# Patient Record
Sex: Male | Born: 1975 | Race: White | Hispanic: No | Marital: Married | Smoking: Never smoker
Health system: Southern US, Community
[De-identification: ages and names within clinical notes are randomized; demographics above are authoritative.]

## PROBLEM LIST (undated history)

## (undated) DIAGNOSIS — Z789 Other specified health status: Secondary | ICD-10-CM

## (undated) HISTORY — PX: FINGER SURGERY: SHX640

---

## 2012-04-03 ENCOUNTER — Emergency Department (HOSPITAL_COMMUNITY)
Admission: EM | Admit: 2012-04-03 | Discharge: 2012-04-03 | Disposition: A | Discharge status: Home / Self Care | Payer: BC Managed Care – PPO | Attending: Emergency Medicine | Admitting: Emergency Medicine

## 2012-04-03 ENCOUNTER — Encounter (HOSPITAL_COMMUNITY): Payer: Self-pay | Admitting: Emergency Medicine

## 2012-04-03 DIAGNOSIS — Y9389 Activity, other specified: Secondary | ICD-10-CM | POA: Insufficient documentation

## 2012-04-03 DIAGNOSIS — W261XXA Contact with sword or dagger, initial encounter: Secondary | ICD-10-CM | POA: Insufficient documentation

## 2012-04-03 DIAGNOSIS — S61209A Unspecified open wound of unspecified finger without damage to nail, initial encounter: Secondary | ICD-10-CM | POA: Insufficient documentation

## 2012-04-03 DIAGNOSIS — Y929 Unspecified place or not applicable: Secondary | ICD-10-CM | POA: Insufficient documentation

## 2012-04-03 DIAGNOSIS — W260XXA Contact with knife, initial encounter: Secondary | ICD-10-CM | POA: Insufficient documentation

## 2012-04-03 DIAGNOSIS — S61219A Laceration without foreign body of unspecified finger without damage to nail, initial encounter: Secondary | ICD-10-CM

## 2012-04-03 MED ORDER — LIDOCAINE HCL 2 % IJ SOLN
INTRAMUSCULAR | Status: AC
  Administered 2012-04-03: 20:00:00
  Filled 2012-04-03: qty 20

## 2012-04-03 NOTE — ED Provider Notes (Signed)
History     CSN: [id number]  Arrival date & time 04/03/12  7829   First MD Initiated Contact with Patient 04/03/12 1941      Chief Complaint  Patient presents with  . Laceration    (Consider location/radiation/quality/duration/timing/severity/associated sxs/prior treatment) HPI Aberham Mcfarren is a 36 y.o. male who presents with complaint or laceration to right 5th finger. States was reaching into a drawer where he had a knife, states it cut him on the outer part of proximal right 5th finger. States bleeding controlled with pressure and elevation. Denies numbness or weakness distal to the cut.  History reviewed. No pertinent past medical history.  Past Surgical History  Procedure Date  . Finger surgery     History reviewed. No pertinent family history.  History  Substance Use Topics  . Smoking status: Never Smoker   . Smokeless tobacco: Not on file  . Alcohol Use: Yes     Comment: occ      Review of Systems  Constitutional: Negative for fever and chills.  Musculoskeletal:       Finger pain  Skin: Positive for wound.  Neurological: Negative for weakness and numbness.    Allergies  Review of patient's allergies indicates no known allergies.  Home Medications  No current outpatient prescriptions on file.  BP 145/90  Pulse 59  Temp 98.8 F (37.1 C) (Oral)  Resp 16  SpO2 100%  Physical Exam  Nursing note and vitals reviewed. Constitutional: He appears well-developed and well-nourished. No distress.  Cardiovascular: Normal rate, regular rhythm and normal heart sounds.   Pulmonary/Chest: Effort normal and breath sounds normal. No respiratory distress. He has no wheezes. He has no rales.  Musculoskeletal:       2cm laceration to the ulnar aspect of proximal right 5th finger. Bleeding controlled. Full rom of the finger at all joints. Good strength with flexion and extension against resistance. Normal 2 pt discrimination. Normal sensation distal finger.       Neurological: He is alert.  Skin: Skin is warm and dry.    ED Course  Procedures (including critical care time)  LACERATION REPAIR Performed by: Lottie Mussel Authorized by: Jaynie Crumble A Consent: Verbal consent obtained. Risks and benefits: risks, benefits and alternatives were discussed Consent given by: patient Patient identity confirmed: provided demographic data Prepped and Draped in normal sterile fashion Wound explored  Laceration Location: 5thfinger  Laceration Length: 2cm  No Foreign Bodies seen or palpated  Anesthesia: local infiltration  Local anesthetic: lidocaine 2% wo epinephrine  Anesthetic total: 2 ml  Irrigation method: syringe Amount of cleaning: standard  Skin closure: prolene 5.0  Number of sutures: 5  Technique: simple interrupted  Patient tolerance: Patient tolerated the procedure well with no immediate complications.   1. Laceration of finger of right hand       MDM  Superficial laceration to the right 5th finger. No evidence of tendon or nerve injury. Good cap refill to the finger. Laceration repaired. Will d/c home with follow up 7 days for suture removal or sooner as needed. Tetanus up to date        Lottie Mussel, [place] 04/03/12 2103

## 2012-04-03 NOTE — ED Notes (Signed)
Patient given discharge instructions, information, prescriptions, and diet order. Patient states that they adequately understand discharge information given and to return to ED if symptoms return or worsen.     

## 2012-04-03 NOTE — ED Notes (Signed)
Pt states he was unjamming a drawer and a knife was in the drawer and he cut his hand on it  Pt has a laceration noted to the outside of his pinky finger on the right hand  Bleeding controlled at this time

## 2012-04-03 NOTE — ED Provider Notes (Signed)
Medical screening examination/treatment/procedure(s) were performed by non-physician practitioner and as supervising physician I was immediately available for consultation/collaboration.    Leland Raver R Suella Cogar, MD 04/03/12 2233 

## 2017-09-10 ENCOUNTER — Ambulatory Visit
Admission: RE | Admit: 2017-09-10 | Discharge: 2017-09-10 | Disposition: A | Discharge status: Home / Self Care | Payer: BLUE CROSS/BLUE SHIELD | Source: Ambulatory Visit | Attending: Family Medicine | Admitting: Family Medicine

## 2017-09-10 ENCOUNTER — Other Ambulatory Visit: Payer: Self-pay | Admitting: Family Medicine

## 2017-09-10 DIAGNOSIS — S6991XA Unspecified injury of right wrist, hand and finger(s), initial encounter: Secondary | ICD-10-CM | POA: Diagnosis not present

## 2017-09-10 DIAGNOSIS — M25531 Pain in right wrist: Secondary | ICD-10-CM

## 2017-09-10 DIAGNOSIS — L309 Dermatitis, unspecified: Secondary | ICD-10-CM | POA: Diagnosis not present

## 2018-07-16 ENCOUNTER — Emergency Department (HOSPITAL_COMMUNITY)
Admission: EM | Admit: 2018-07-16 | Discharge: 2018-07-17 | Disposition: A | Discharge status: Home / Self Care | Payer: BLUE CROSS/BLUE SHIELD | Attending: Emergency Medicine | Admitting: Emergency Medicine

## 2018-07-16 ENCOUNTER — Other Ambulatory Visit: Payer: Self-pay

## 2018-07-16 ENCOUNTER — Encounter (HOSPITAL_COMMUNITY): Payer: Self-pay | Admitting: Emergency Medicine

## 2018-07-16 ENCOUNTER — Emergency Department (HOSPITAL_COMMUNITY): Payer: BLUE CROSS/BLUE SHIELD

## 2018-07-16 DIAGNOSIS — Y9389 Activity, other specified: Secondary | ICD-10-CM | POA: Diagnosis not present

## 2018-07-16 DIAGNOSIS — S61215A Laceration without foreign body of left ring finger without damage to nail, initial encounter: Secondary | ICD-10-CM | POA: Insufficient documentation

## 2018-07-16 DIAGNOSIS — Y999 Unspecified external cause status: Secondary | ICD-10-CM | POA: Insufficient documentation

## 2018-07-16 DIAGNOSIS — Z23 Encounter for immunization: Secondary | ICD-10-CM | POA: Insufficient documentation

## 2018-07-16 DIAGNOSIS — S61209A Unspecified open wound of unspecified finger without damage to nail, initial encounter: Secondary | ICD-10-CM

## 2018-07-16 DIAGNOSIS — W274XXA Contact with kitchen utensil, initial encounter: Secondary | ICD-10-CM | POA: Diagnosis not present

## 2018-07-16 DIAGNOSIS — Y929 Unspecified place or not applicable: Secondary | ICD-10-CM | POA: Diagnosis not present

## 2018-07-16 DIAGNOSIS — S61213A Laceration without foreign body of left middle finger without damage to nail, initial encounter: Secondary | ICD-10-CM

## 2018-07-16 DIAGNOSIS — S6992XA Unspecified injury of left wrist, hand and finger(s), initial encounter: Secondary | ICD-10-CM | POA: Diagnosis not present

## 2018-07-16 DIAGNOSIS — S61203A Unspecified open wound of left middle finger without damage to nail, initial encounter: Secondary | ICD-10-CM | POA: Diagnosis not present

## 2018-07-16 MED ORDER — LIDOCAINE-EPINEPHRINE-TETRACAINE (LET) SOLUTION
3.0000 mL | Freq: Once | NASAL | Status: DC
  Filled 2018-07-16: qty 3

## 2018-07-16 NOTE — ED Triage Notes (Signed)
Patient was slicing a sweet potato and sliced his left ring finger. Patient states that he does not when he had a tetanus shot.

## 2018-07-17 MED ORDER — LIDOCAINE HCL 2 % IJ SOLN
10.0000 mL | Freq: Once | INTRAMUSCULAR | Status: DC
  Filled 2018-07-17: qty 20

## 2018-07-17 MED ORDER — TETANUS-DIPHTH-ACELL PERTUSSIS 5-2.5-18.5 LF-MCG/0.5 IM SUSP
0.5000 mL | Freq: Once | INTRAMUSCULAR | Status: AC
  Administered 2018-07-17: 0.5 mL via INTRAMUSCULAR
  Filled 2018-07-17: qty 0.5

## 2018-07-17 NOTE — Discharge Instructions (Signed)
Thank you for allowing me to care for you today in the Emergency Department.   Keep the area clean and dry with warm water and soap.  Clean the area at least once daily.  Then apply bacitracin and a nonstick dressing to the wound on your ring finger followed by Coban.  Keep the wound covered until it heals.  You want enough pressure to keep it from using, but not too tight that it cuts off blood flow to the tip of your finger.  On your ring finger, a product called Wound Seal was used to stop the bleeding.  This essentially forms a scab over the wound and will slowly start to fall off.  Do not try to pull off the scab allowed to fall off naturally.  Keep the wound on this finger covered until the scab naturally falls off.  Your tetanus immunization was updated today and is good for the next 10 years.  You can take Tylenol and ibuprofen for pain control.  The suture in your middle finger needs to come out in approximately 7 days.  You can go to urgent care, primary care, or return to the emergency department for suture removal.  Antibiotics are not indicated for this injury.  However, you should return to the emergency department if you develop signs or symptoms of infection, which include fever, chills, if the area around the wounds get red, hot to the touch, swollen, or if you start to notice red streaking down the finger or hand.

## 2018-07-17 NOTE — ED Provider Notes (Signed)
[facility]-EMERGENCY DEPT Provider Note   CSN: [id number] Arrival date & time: 07/16/18  2305    History   Chief Complaint Chief Complaint  Patient presents with  . Extremity Laceration    HPI Dustin Richardson is a 43 y.o. male with no pertinent past medical history who is right-hand dominant who presents to the emergency department with a chief complaint of left hand injury.  The patient reports that he sliced his left third and fourth fingers earlier tonight while peeling a sweet potato that occurred just prior to arrival.  He reports that he has been unable to get the bleeding on the left ring finger to stop despite applying pressure and rinsing off the finger with water prior to arrival.  He reports that he sliced off the entire pad of the finger on the fourth digit and placed it in an ice bag and has it with him.  He denies numbness, weakness, fever, or chills.  He is unsure when his Tdap was last updated.  He does not take any blood thinners and has not been drinking alcohol.     The history is provided by the patient. No language interpreter was used.    History reviewed. No pertinent past medical history.  There are no active problems to display for this patient.   Past Surgical History:  Procedure Laterality Date  . FINGER SURGERY          Home Medications    Prior to Admission medications   Not on File    Family History History reviewed. No pertinent family history.  Social History Social History   Tobacco Use  . Smoking status: Never Smoker  . Smokeless tobacco: Never Used  Substance Use Topics  . Alcohol use: Yes    Comment: occ  . Drug use: No     Allergies   Patient has no known allergies.   Review of Systems Review of Systems  Constitutional: Negative for activity change.  Respiratory: Negative for shortness of breath.   Cardiovascular: Negative for chest pain.  Gastrointestinal: Negative for abdominal pain.    Musculoskeletal: Negative for back pain.  Skin: Positive for wound. Negative for rash.  Neurological: Negative for weakness and numbness.  Hematological: Does not bruise/bleed easily.     Physical Exam Updated Vital Signs BP (!) 154/90   Pulse 69   Temp 98.4 F (36.9 C) (Oral)   Resp 18   Ht 5\' 10"  (1.778 m)   Wt 79.4 kg   SpO2 95%   BMI 25.11 kg/m   Physical Exam Vitals signs and nursing note reviewed.  Constitutional:      Appearance: He is well-developed.  HENT:     Head: Normocephalic.  Eyes:     Conjunctiva/sclera: Conjunctivae normal.  Neck:     Musculoskeletal: Neck supple.  Cardiovascular:     Rate and Rhythm: Normal rate and regular rhythm.     Heart sounds: No murmur.  Pulmonary:     Effort: Pulmonary effort is normal.  Abdominal:     General: There is no distension.     Palpations: Abdomen is soft.  Musculoskeletal:     Comments: The epidermis of the palmar surface has been avulsed.  Brisk bleeding noted to the ring finger of the left hand.  There is a 1 cm laceration noted to the palmar surface of the third digit of the left hand that is hemostatic.  Both wounds appear clean.   5 out of 5 strength  against resistance of the third and fourth fingers of the left hand with flexion and extension.  Radial pulses are 2+ and symmetric.  Sensation is intact and equal throughout the 4 distal tips of the left third and fourth fingers.  Good capillary refill.  Skin:    General: Skin is warm and dry.  Neurological:     Mental Status: He is alert.  Psychiatric:        Behavior: Behavior normal.      ED Treatments / Results  Labs (all labs ordered are listed, but only abnormal results are displayed) Labs Reviewed - No data to display  EKG None  Radiology Dg Finger Ring Left  Result Date: 07/16/2018 CLINICAL DATA:  Laceration wall slicing as we potato. EXAM: LEFT RING FINGER 2+V COMPARISON:  None. FINDINGS: Skin and soft tissue irregularity about the distal  digit. Overlying dressing in place. No radiopaque foreign body. There is no evidence of fracture or dislocation. There is no evidence of arthropathy or other focal bone abnormality. IMPRESSION: Skin and soft tissue irregularity about the distal digit consistent with laceration. No radiopaque foreign body or osseous abnormality. Electronically Signed   By: Narda Rutherford M.D.   On: 07/16/2018 23:55    Procedures .Marland KitchenLaceration Repair Date/Time: 07/17/2018 4:27 AM Performed by: Barkley Boards, PA-C Authorized by: Barkley Boards, PA-C   Consent:    Consent obtained:  Verbal   Consent given by:  Patient   Risks discussed:  Infection, need for additional repair, poor cosmetic result, poor wound healing, vascular damage, tendon damage, retained foreign body and nerve damage   Alternatives discussed:  No treatment Anesthesia (see MAR for exact dosages):    Anesthesia method:  Nerve block   Block location:  Left third finger    Block needle gauge:  27 G   Block anesthetic:  Lidocaine 2% w/o epi   Block technique:  Digital block    Block injection procedure:  Anatomic landmarks identified, anatomic landmarks palpated, negative aspiration for blood, introduced needle and incremental injection   Block outcome:  Anesthesia achieved Laceration details:    Location:  Finger   Finger location:  L long finger   Length (cm):  1 Repair type:    Repair type:  Simple Pre-procedure details:    Preparation:  Patient was prepped and draped in usual sterile fashion Exploration:    Hemostasis achieved with:  Direct pressure   Wound exploration: wound explored through full range of motion and entire depth of wound probed and visualized     Wound extent: no foreign bodies/material noted, no muscle damage noted, no nerve damage noted, no tendon damage noted, no underlying fracture noted and no vascular damage noted   Treatment:    Area cleansed with:  Shur-Clens   Amount of cleaning:  Standard Skin repair:      Repair method:  Sutures   Suture size:  4-0   Suture material:  Prolene   Suture technique:  Horizontal mattress   Number of sutures:  1 Approximation:    Approximation:  Close Post-procedure details:    Dressing:  Sterile dressing and non-adherent dressing   Patient tolerance of procedure:  Tolerated well, no immediate complications .Marland KitchenLaceration Repair Date/Time: 07/17/2018 4:29 AM Performed by: Barkley Boards, PA-C Authorized by: Barkley Boards, PA-C   Consent:    Consent obtained:  Verbal   Consent given by:  Patient   Risks discussed:  Infection, pain, poor cosmetic result and poor wound healing  Alternatives discussed:  No treatment Anesthesia (see MAR for exact dosages):    Anesthesia method:  Nerve block   Block technique:  Digital block    Block injection procedure:  Anatomic landmarks identified, introduced needle, incremental injection, negative aspiration for blood and anatomic landmarks palpated   Block outcome:  Anesthesia achieved Laceration details:    Location:  Finger   Finger location:  L ring finger Repair type:    Repair type:  Intermediate Pre-procedure details:    Preparation:  Patient was prepped and draped in usual sterile fashion and imaging obtained to evaluate for foreign bodies Exploration:    Hemostasis achieved with:  Cautery and direct pressure (Wound Seal)   Wound exploration: wound explored through full range of motion and entire depth of wound probed and visualized     Wound extent: no fascia violation noted, no foreign bodies/material noted, no muscle damage noted, no tendon damage noted, no underlying fracture noted and no vascular damage noted     Contaminated: no   Treatment:    Area cleansed with:  Shur-Clens   Amount of cleaning:  Extensive   Visualized foreign bodies/material removed: no   Skin repair:    Repair method:  Tissue adhesive Post-procedure details:    Dressing:  Non-adherent dressing   Patient tolerance of  procedure:  Tolerated well, no immediate complications   (including critical care time)  Medications Ordered in ED Medications  lidocaine-EPINEPHrine-tetracaine (LET) solution (3 mLs Topical Not Given 07/17/18 0043)  lidocaine (XYLOCAINE) 2 % (with pres) injection 200 mg (0 mg Infiltration Hold 07/17/18 0043)  Tdap (BOOSTRIX) injection 0.5 mL (0.5 mLs Intramuscular Given 07/17/18 0042)     Initial Impression / Assessment and Plan / ED Course  I have reviewed the triage vital signs and the nursing notes.  Pertinent labs & imaging results that were available during my care of the patient were reviewed by me and considered in my medical decision making (see chart for details).        25-year-old male with no past medical history who presents to the emergency department with a left hand wound to the third and fourth fingers that began after using a potato peeler earlier tonight.  Called by nursing staff as they were unable to control bleeding with direct pressure. He reports that he sliced off the pad of the fourth finger.  Tdap was not updated, but has been updated in the ER.  Imaging of the left ring finger was obtained and is unremarkable for fracture.  Shared decision making conversation regarding the avulsion fingertip wound on the fourth finger.  We discussed possibly attempting to reapproximate the epidermis versus sealing the wound after bleeding is controlled.  He does not wish to have the epidermis reapproximated.  Wound seal powder was applied and a pressure dressing was then placed on the finger.  On reevaluation, almost all of the bleeding had stopped except for a small venule.  This was then cauterized and the patient had no more oozing or bleeding.  A nonadhesive dressing was placed over this digit.  Home care was explained.  A single horizontal mattress suture was placed on the pad of the third finger after the wound was copiously irrigated.  Wound home care instruction provided.  He has no  risk factors for poor wound healing.  He should have his sutures removed in approximately 7 days.  Return precautions given.  He is hemodynamically stable and in no acute distress.  Safe for discharge home  with outpatient follow-up at this time.  Final Clinical Impressions(s) / ED Diagnoses   Final diagnoses:  Laceration of left middle finger without foreign body without damage to nail, initial encounter  Avulsion of fingertip, initial encounter    ED Discharge Orders    None       Barkley Boards, PA-C 07/17/18 0435    Nira Conn, MD 07/17/18 [phone]

## 2019-03-02 IMAGING — CR DG WRIST COMPLETE 3+V*R*
4 series · 4 of 4 positions shown · non-contrast
Comparison: None.

CLINICAL DATA: Pain on extension of the right wrist post fall.

EXAM:
RIGHT WRIST - COMPLETE 3+ VIEW

[x wrist pa right]
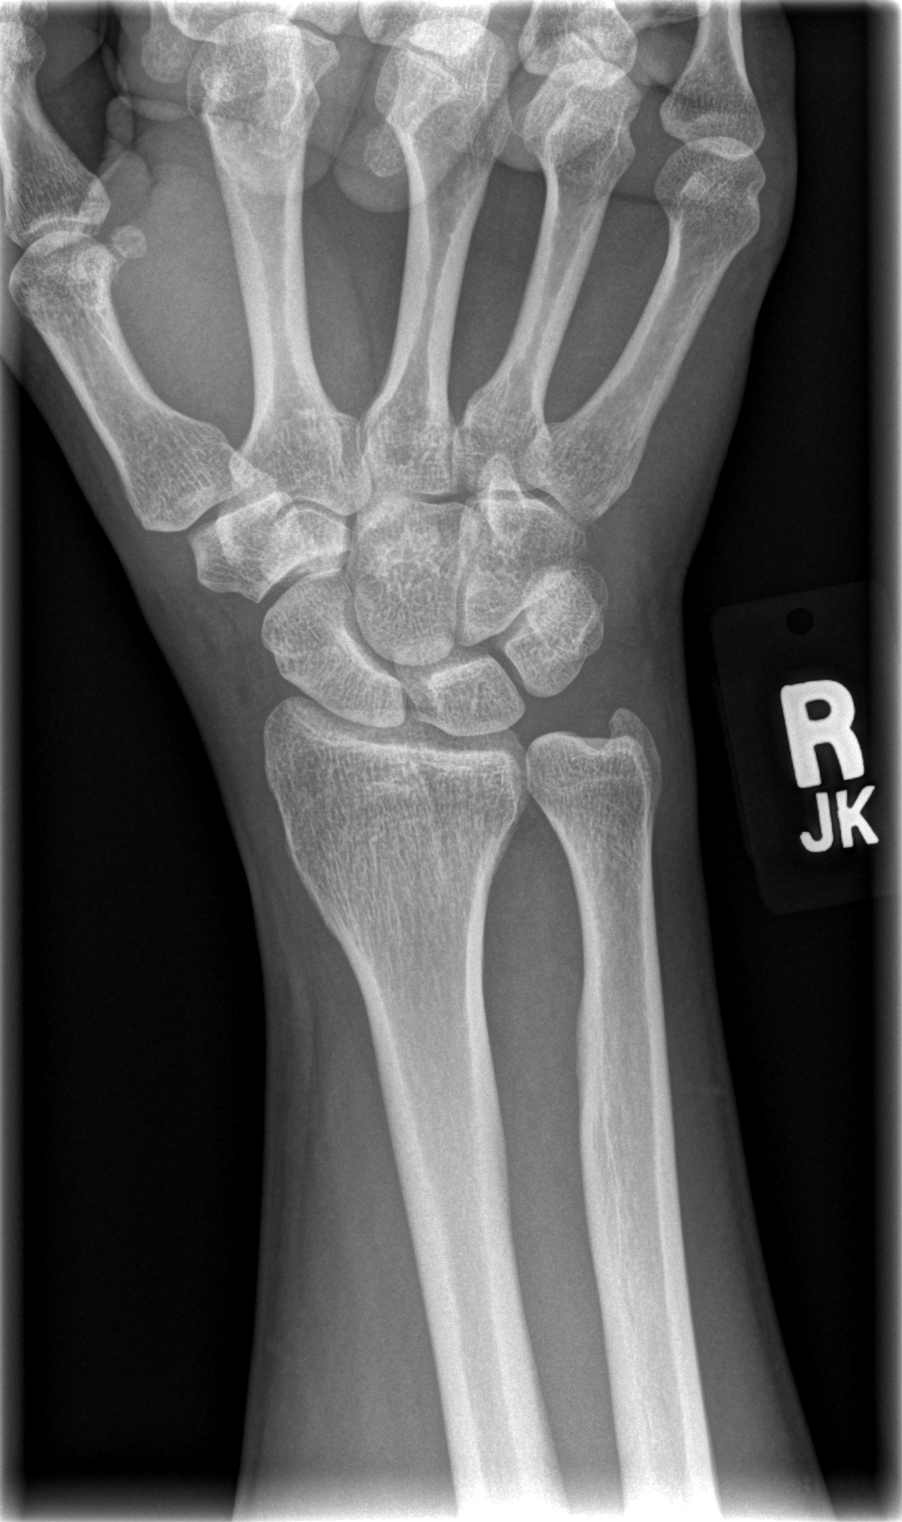

[x wrist obl right]
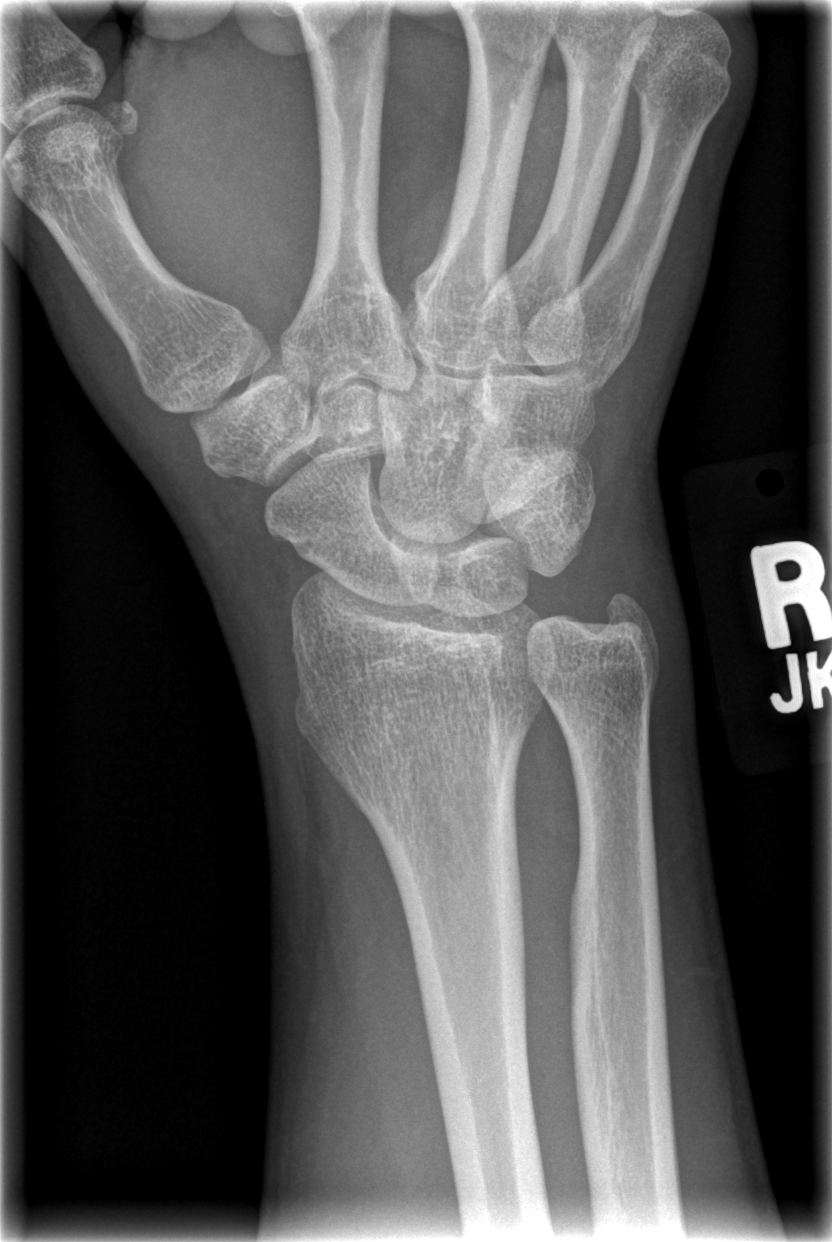

[x wrist lat right]
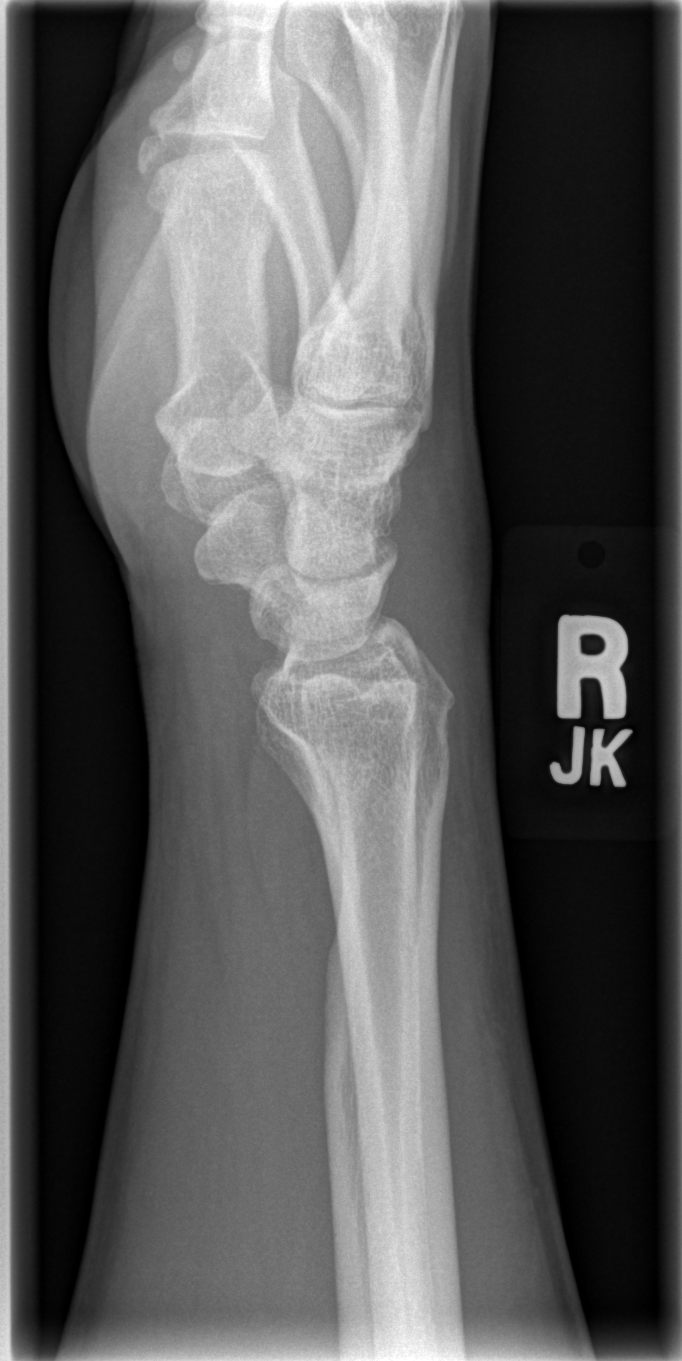

[x navicular]
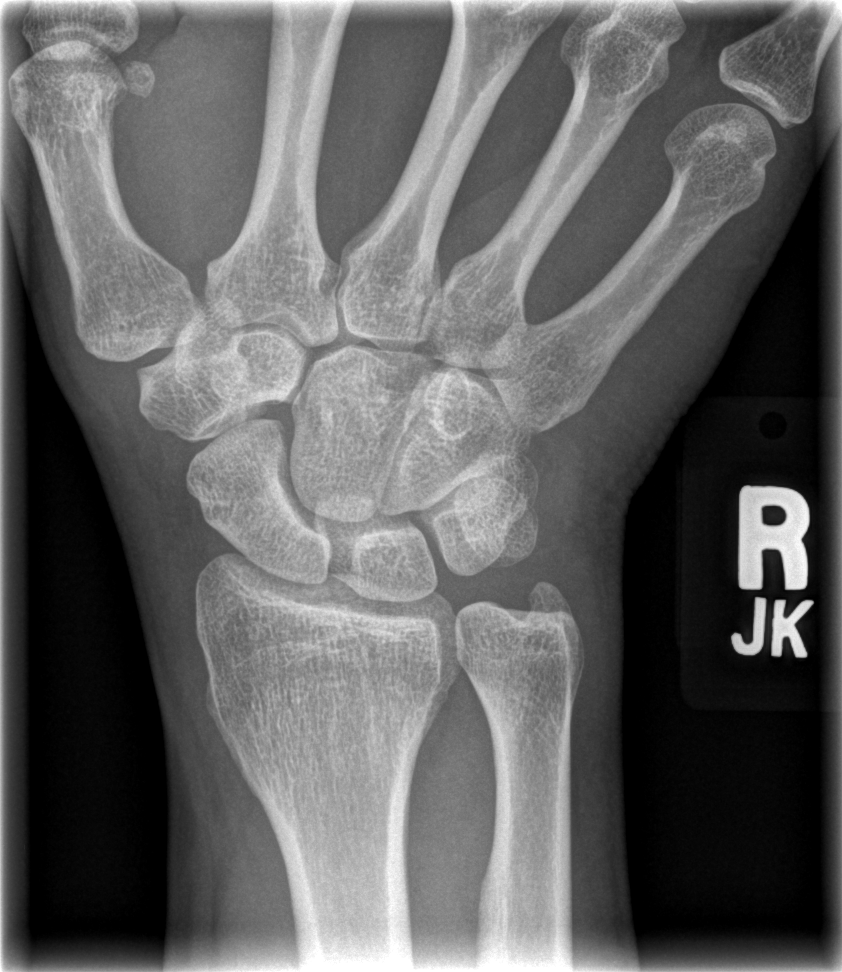

[4 of 4 positions shown; findings below may reference images not displayed]

FINDINGS: There is no evidence of fracture or dislocation. There is no
evidence of arthropathy or other focal bone abnormality. Soft
tissues are unremarkable.
IMPRESSION: Negative.

## 2020-01-05 IMAGING — CR DG FINGER RING 2+V*L*
3 series · 3 of 3 positions shown · non-contrast
Comparison: None.

CLINICAL DATA: Laceration wall slicing as we potato.

EXAM:
LEFT RING FINGER 2+V

[x finger pa left]
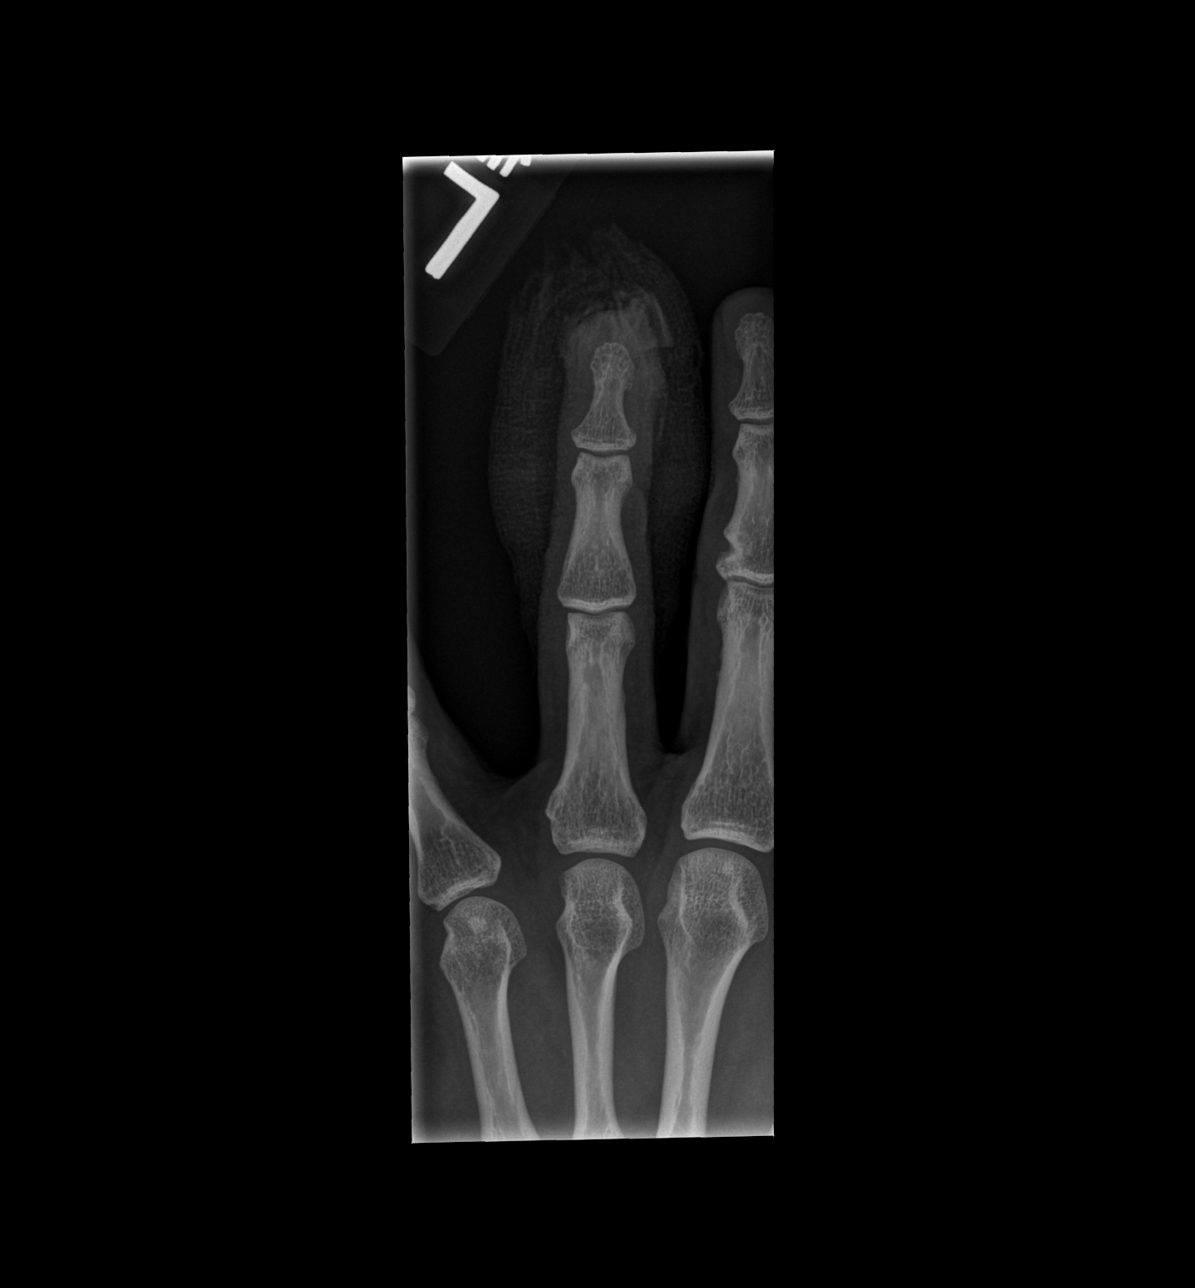

[x finger obl left]
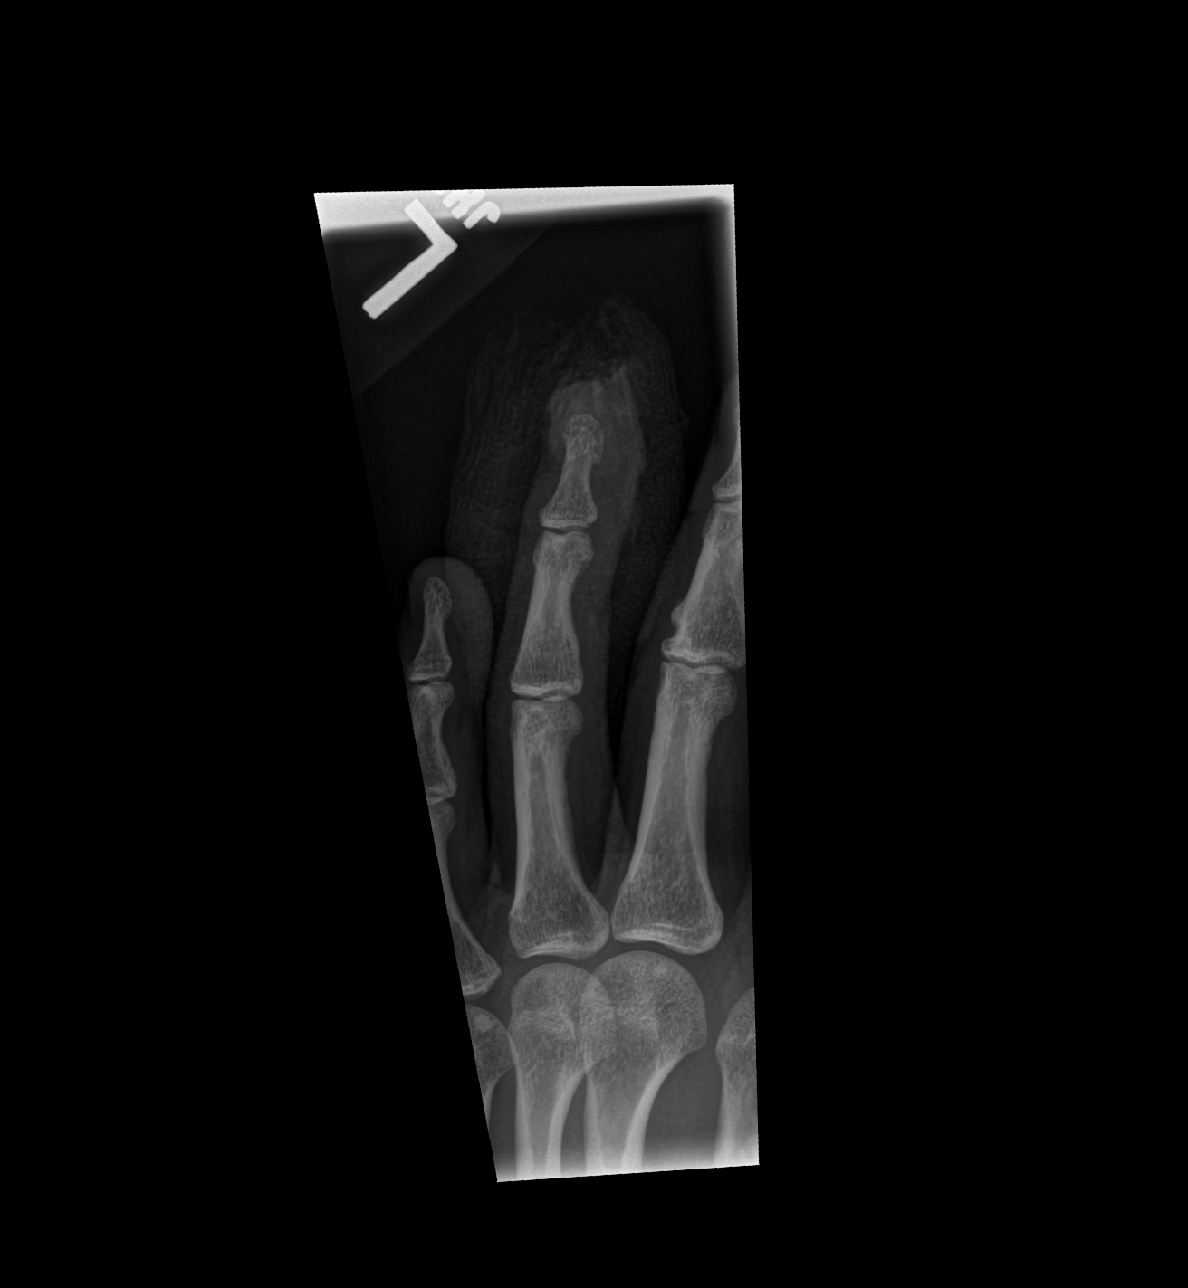

[x finger lat left]
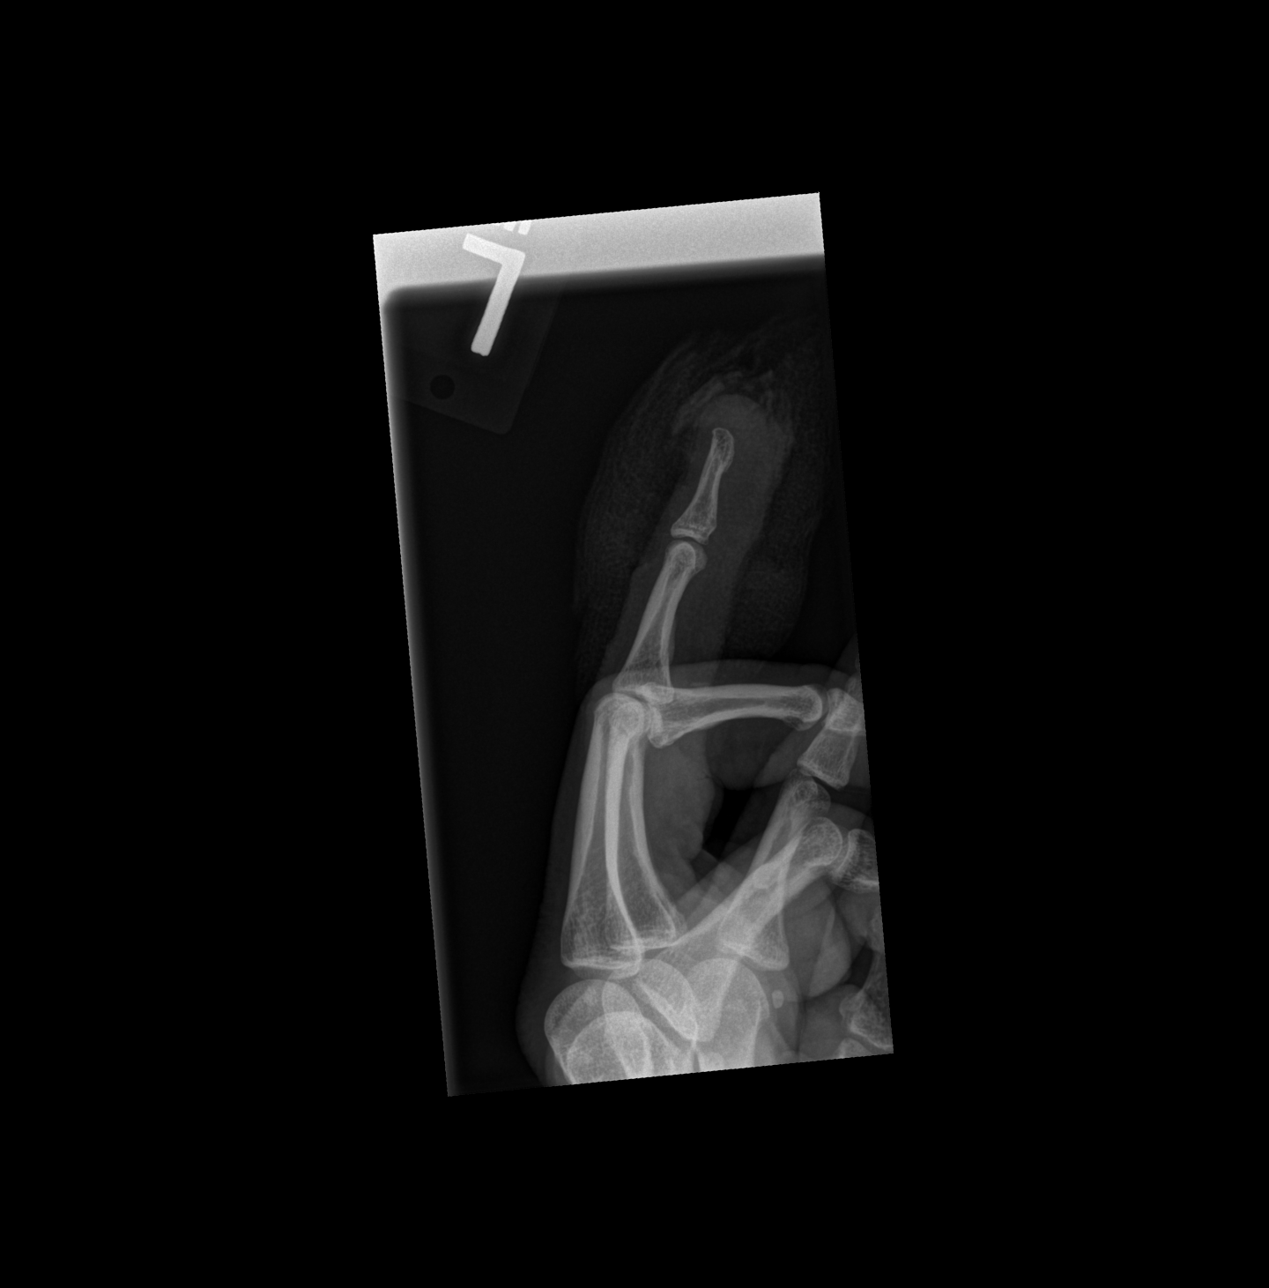

[3 of 3 positions shown; findings below may reference images not displayed]

FINDINGS: Skin and soft tissue irregularity about the distal digit. Overlying
dressing in place. No radiopaque foreign body. There is no evidence
of fracture or dislocation. There is no evidence of arthropathy or
other focal bone abnormality.
IMPRESSION: Skin and soft tissue irregularity about the distal digit consistent
with laceration. No radiopaque foreign body or osseous abnormality.

## 2021-02-05 ENCOUNTER — Other Ambulatory Visit: Payer: Self-pay | Admitting: *Deleted

## 2021-02-05 ENCOUNTER — Other Ambulatory Visit: Payer: Self-pay

## 2021-02-05 DIAGNOSIS — Z125 Encounter for screening for malignant neoplasm of prostate: Secondary | ICD-10-CM

## 2021-02-05 NOTE — Progress Notes (Signed)
Patient: Dustin Richardson           Date of Birth: 03/14/1976           MRN: [id number] Visit Date: 02/05/2021 PCP: Patient, No Pcp Per (Inactive)  Prostate Cancer Screening Date of last physical exam:  (June 2022) Date of last rectal exam:  (N/A) Have you ever had any of the following?: Family member with prostate cancer Have you ever had or been told you have an allergy to latex products?: No Are you currently taking any natural prostate preparations?: No Are you currently experiencing any urinary symptoms?: No  Prostate Exam Exam not completed. PSA Only.  Patient's History There are no problems to display for this patient.  No past medical history on file.  No family history on file.  Social History   Occupational History   Not on file  Tobacco Use   Smoking status: Never   Smokeless tobacco: Never  Vaping Use   Vaping Use: Never used  Substance and Sexual Activity   Alcohol use: Yes    Comment: occ   Drug use: No   Sexual activity: Not on file

## 2021-02-06 LAB — PSA: Prostate Specific Ag, Serum: 0.4 ng/mL (ref 0.0–4.0)

## 2021-02-14 ENCOUNTER — Telehealth: Payer: Self-pay

## 2021-02-14 NOTE — Telephone Encounter (Signed)
Normal results letter mailed.   February 14, 2021         Dear Mr. Dustin Richardson  Thank you for your participation in the Prostate Cancer Screening at [facility] on February 05, 2021.   The result of your blood test for the level of the Prostate Specific Antigen (PSA) was found to be within normal range. It is recommended that you continue screening at age 44 every 1-2 years.  If you have any questions about these results, please call Fonnie Mu at [phone] or Delvis Kau at [phone]  Sincerely,       Fonnie Mu, MSN, RN, OCN Oncology Outreach Manager [facility]   Clois Dupes, LPN Oncology Outreach Manager [facility]

## 2021-02-19 ENCOUNTER — Telehealth: Payer: Self-pay

## 2021-02-19 NOTE — Telephone Encounter (Addendum)
Patient returned call, was informed normal PSA results. Patient verbalized understanding.   Left message on voicemail requesting return call. Regarding lab results.

## 2021-12-11 DIAGNOSIS — M9902 Segmental and somatic dysfunction of thoracic region: Secondary | ICD-10-CM | POA: Diagnosis not present

## 2021-12-11 DIAGNOSIS — M5080 Other cervical disc disorders, unspecified cervical region: Secondary | ICD-10-CM | POA: Diagnosis not present

## 2021-12-11 DIAGNOSIS — M9901 Segmental and somatic dysfunction of cervical region: Secondary | ICD-10-CM | POA: Diagnosis not present

## 2021-12-11 DIAGNOSIS — M9903 Segmental and somatic dysfunction of lumbar region: Secondary | ICD-10-CM | POA: Diagnosis not present

## 2022-06-12 DIAGNOSIS — M5080 Other cervical disc disorders, unspecified cervical region: Secondary | ICD-10-CM | POA: Diagnosis not present

## 2022-06-12 DIAGNOSIS — M9903 Segmental and somatic dysfunction of lumbar region: Secondary | ICD-10-CM | POA: Diagnosis not present

## 2022-06-12 DIAGNOSIS — M9901 Segmental and somatic dysfunction of cervical region: Secondary | ICD-10-CM | POA: Diagnosis not present

## 2022-06-12 DIAGNOSIS — M9902 Segmental and somatic dysfunction of thoracic region: Secondary | ICD-10-CM | POA: Diagnosis not present

## 2022-06-19 DIAGNOSIS — M9904 Segmental and somatic dysfunction of sacral region: Secondary | ICD-10-CM | POA: Diagnosis not present

## 2022-06-19 DIAGNOSIS — M7918 Myalgia, other site: Secondary | ICD-10-CM | POA: Diagnosis not present

## 2022-06-19 DIAGNOSIS — M9905 Segmental and somatic dysfunction of pelvic region: Secondary | ICD-10-CM | POA: Diagnosis not present

## 2022-06-19 DIAGNOSIS — M9903 Segmental and somatic dysfunction of lumbar region: Secondary | ICD-10-CM | POA: Diagnosis not present

## 2022-06-26 DIAGNOSIS — M9904 Segmental and somatic dysfunction of sacral region: Secondary | ICD-10-CM | POA: Diagnosis not present

## 2022-06-26 DIAGNOSIS — M9903 Segmental and somatic dysfunction of lumbar region: Secondary | ICD-10-CM | POA: Diagnosis not present

## 2022-06-26 DIAGNOSIS — M9905 Segmental and somatic dysfunction of pelvic region: Secondary | ICD-10-CM | POA: Diagnosis not present

## 2022-06-26 DIAGNOSIS — M7918 Myalgia, other site: Secondary | ICD-10-CM | POA: Diagnosis not present

## 2022-07-15 DIAGNOSIS — M7918 Myalgia, other site: Secondary | ICD-10-CM | POA: Diagnosis not present

## 2022-07-15 DIAGNOSIS — M9904 Segmental and somatic dysfunction of sacral region: Secondary | ICD-10-CM | POA: Diagnosis not present

## 2022-07-15 DIAGNOSIS — M9905 Segmental and somatic dysfunction of pelvic region: Secondary | ICD-10-CM | POA: Diagnosis not present

## 2022-07-15 DIAGNOSIS — M9903 Segmental and somatic dysfunction of lumbar region: Secondary | ICD-10-CM | POA: Diagnosis not present

## 2022-07-31 DIAGNOSIS — M9904 Segmental and somatic dysfunction of sacral region: Secondary | ICD-10-CM | POA: Diagnosis not present

## 2022-07-31 DIAGNOSIS — M7918 Myalgia, other site: Secondary | ICD-10-CM | POA: Diagnosis not present

## 2022-07-31 DIAGNOSIS — M9903 Segmental and somatic dysfunction of lumbar region: Secondary | ICD-10-CM | POA: Diagnosis not present

## 2022-07-31 DIAGNOSIS — M9905 Segmental and somatic dysfunction of pelvic region: Secondary | ICD-10-CM | POA: Diagnosis not present

## 2022-08-14 DIAGNOSIS — M9905 Segmental and somatic dysfunction of pelvic region: Secondary | ICD-10-CM | POA: Diagnosis not present

## 2022-08-14 DIAGNOSIS — M7918 Myalgia, other site: Secondary | ICD-10-CM | POA: Diagnosis not present

## 2022-08-14 DIAGNOSIS — M9904 Segmental and somatic dysfunction of sacral region: Secondary | ICD-10-CM | POA: Diagnosis not present

## 2022-08-14 DIAGNOSIS — M9903 Segmental and somatic dysfunction of lumbar region: Secondary | ICD-10-CM | POA: Diagnosis not present

## 2022-12-10 DIAGNOSIS — M9905 Segmental and somatic dysfunction of pelvic region: Secondary | ICD-10-CM | POA: Diagnosis not present

## 2022-12-10 DIAGNOSIS — M9904 Segmental and somatic dysfunction of sacral region: Secondary | ICD-10-CM | POA: Diagnosis not present

## 2022-12-10 DIAGNOSIS — M9903 Segmental and somatic dysfunction of lumbar region: Secondary | ICD-10-CM | POA: Diagnosis not present

## 2022-12-10 DIAGNOSIS — M7918 Myalgia, other site: Secondary | ICD-10-CM | POA: Diagnosis not present

## 2022-12-25 DIAGNOSIS — M545 Low back pain, unspecified: Secondary | ICD-10-CM | POA: Diagnosis not present

## 2022-12-30 DIAGNOSIS — M9905 Segmental and somatic dysfunction of pelvic region: Secondary | ICD-10-CM | POA: Diagnosis not present

## 2022-12-30 DIAGNOSIS — M7918 Myalgia, other site: Secondary | ICD-10-CM | POA: Diagnosis not present

## 2022-12-30 DIAGNOSIS — M9904 Segmental and somatic dysfunction of sacral region: Secondary | ICD-10-CM | POA: Diagnosis not present

## 2022-12-30 DIAGNOSIS — M9903 Segmental and somatic dysfunction of lumbar region: Secondary | ICD-10-CM | POA: Diagnosis not present

## 2023-01-03 DIAGNOSIS — M545 Low back pain, unspecified: Secondary | ICD-10-CM | POA: Diagnosis not present

## 2023-01-04 DIAGNOSIS — M5459 Other low back pain: Secondary | ICD-10-CM | POA: Diagnosis not present

## 2023-01-04 DIAGNOSIS — M6281 Muscle weakness (generalized): Secondary | ICD-10-CM | POA: Diagnosis not present

## 2023-01-08 DIAGNOSIS — M5459 Other low back pain: Secondary | ICD-10-CM | POA: Diagnosis not present

## 2023-01-08 DIAGNOSIS — M6281 Muscle weakness (generalized): Secondary | ICD-10-CM | POA: Diagnosis not present

## 2023-01-12 DIAGNOSIS — M6281 Muscle weakness (generalized): Secondary | ICD-10-CM | POA: Diagnosis not present

## 2023-01-12 DIAGNOSIS — M5459 Other low back pain: Secondary | ICD-10-CM | POA: Diagnosis not present

## 2023-01-14 DIAGNOSIS — M5459 Other low back pain: Secondary | ICD-10-CM | POA: Diagnosis not present

## 2023-01-14 DIAGNOSIS — M6281 Muscle weakness (generalized): Secondary | ICD-10-CM | POA: Diagnosis not present

## 2023-01-19 DIAGNOSIS — M6281 Muscle weakness (generalized): Secondary | ICD-10-CM | POA: Diagnosis not present

## 2023-01-19 DIAGNOSIS — M5459 Other low back pain: Secondary | ICD-10-CM | POA: Diagnosis not present

## 2023-01-22 DIAGNOSIS — M6281 Muscle weakness (generalized): Secondary | ICD-10-CM | POA: Diagnosis not present

## 2023-01-22 DIAGNOSIS — M5459 Other low back pain: Secondary | ICD-10-CM | POA: Diagnosis not present

## 2023-01-26 DIAGNOSIS — M5459 Other low back pain: Secondary | ICD-10-CM | POA: Diagnosis not present

## 2023-01-26 DIAGNOSIS — M6281 Muscle weakness (generalized): Secondary | ICD-10-CM | POA: Diagnosis not present

## 2023-01-28 DIAGNOSIS — M5459 Other low back pain: Secondary | ICD-10-CM | POA: Diagnosis not present

## 2023-01-28 DIAGNOSIS — M6281 Muscle weakness (generalized): Secondary | ICD-10-CM | POA: Diagnosis not present

## 2023-02-02 DIAGNOSIS — M6281 Muscle weakness (generalized): Secondary | ICD-10-CM | POA: Diagnosis not present

## 2023-02-02 DIAGNOSIS — M5459 Other low back pain: Secondary | ICD-10-CM | POA: Diagnosis not present

## 2023-02-05 DIAGNOSIS — M6281 Muscle weakness (generalized): Secondary | ICD-10-CM | POA: Diagnosis not present

## 2023-02-05 DIAGNOSIS — M5459 Other low back pain: Secondary | ICD-10-CM | POA: Diagnosis not present

## 2023-02-16 DIAGNOSIS — M5459 Other low back pain: Secondary | ICD-10-CM | POA: Diagnosis not present

## 2023-02-16 DIAGNOSIS — M6281 Muscle weakness (generalized): Secondary | ICD-10-CM | POA: Diagnosis not present

## 2023-02-18 DIAGNOSIS — M6281 Muscle weakness (generalized): Secondary | ICD-10-CM | POA: Diagnosis not present

## 2023-02-18 DIAGNOSIS — M5459 Other low back pain: Secondary | ICD-10-CM | POA: Diagnosis not present

## 2023-02-24 DIAGNOSIS — M5459 Other low back pain: Secondary | ICD-10-CM | POA: Diagnosis not present

## 2023-02-24 DIAGNOSIS — M6281 Muscle weakness (generalized): Secondary | ICD-10-CM | POA: Diagnosis not present

## 2023-03-17 DIAGNOSIS — M6281 Muscle weakness (generalized): Secondary | ICD-10-CM | POA: Diagnosis not present

## 2023-03-17 DIAGNOSIS — M5459 Other low back pain: Secondary | ICD-10-CM | POA: Diagnosis not present

## 2023-03-31 DIAGNOSIS — M6281 Muscle weakness (generalized): Secondary | ICD-10-CM | POA: Diagnosis not present

## 2023-03-31 DIAGNOSIS — M5459 Other low back pain: Secondary | ICD-10-CM | POA: Diagnosis not present

## 2023-04-23 DIAGNOSIS — M6281 Muscle weakness (generalized): Secondary | ICD-10-CM | POA: Diagnosis not present

## 2023-04-23 DIAGNOSIS — M5459 Other low back pain: Secondary | ICD-10-CM | POA: Diagnosis not present

## 2023-05-17 DIAGNOSIS — M5459 Other low back pain: Secondary | ICD-10-CM | POA: Diagnosis not present

## 2023-05-17 DIAGNOSIS — M6281 Muscle weakness (generalized): Secondary | ICD-10-CM | POA: Diagnosis not present

## 2023-06-17 DIAGNOSIS — Z0000 Encounter for general adult medical examination without abnormal findings: Secondary | ICD-10-CM | POA: Diagnosis not present

## 2023-06-17 DIAGNOSIS — Z13228 Encounter for screening for other metabolic disorders: Secondary | ICD-10-CM | POA: Diagnosis not present

## 2023-06-17 DIAGNOSIS — Z131 Encounter for screening for diabetes mellitus: Secondary | ICD-10-CM | POA: Diagnosis not present

## 2023-06-17 DIAGNOSIS — Z1329 Encounter for screening for other suspected endocrine disorder: Secondary | ICD-10-CM | POA: Diagnosis not present

## 2023-06-17 DIAGNOSIS — Z13220 Encounter for screening for lipoid disorders: Secondary | ICD-10-CM | POA: Diagnosis not present

## 2023-06-17 DIAGNOSIS — Z125 Encounter for screening for malignant neoplasm of prostate: Secondary | ICD-10-CM | POA: Diagnosis not present

## 2023-07-15 DIAGNOSIS — R7401 Elevation of levels of liver transaminase levels: Secondary | ICD-10-CM | POA: Diagnosis not present

## 2023-08-20 DIAGNOSIS — Z1211 Encounter for screening for malignant neoplasm of colon: Secondary | ICD-10-CM | POA: Diagnosis not present

## 2023-08-20 DIAGNOSIS — K635 Polyp of colon: Secondary | ICD-10-CM | POA: Diagnosis not present

## 2023-08-20 DIAGNOSIS — K5730 Diverticulosis of large intestine without perforation or abscess without bleeding: Secondary | ICD-10-CM | POA: Diagnosis not present

## 2023-10-03 DIAGNOSIS — W19XXXA Unspecified fall, initial encounter: Secondary | ICD-10-CM | POA: Diagnosis not present

## 2023-10-03 DIAGNOSIS — M25572 Pain in left ankle and joints of left foot: Secondary | ICD-10-CM | POA: Diagnosis not present

## 2023-10-03 DIAGNOSIS — S99919A Unspecified injury of unspecified ankle, initial encounter: Secondary | ICD-10-CM | POA: Diagnosis not present

## 2023-10-03 DIAGNOSIS — S82832A Other fracture of upper and lower end of left fibula, initial encounter for closed fracture: Secondary | ICD-10-CM | POA: Diagnosis not present

## 2023-10-03 DIAGNOSIS — R55 Syncope and collapse: Secondary | ICD-10-CM | POA: Diagnosis not present

## 2023-10-03 DIAGNOSIS — Y92832 Beach as the place of occurrence of the external cause: Secondary | ICD-10-CM | POA: Diagnosis not present

## 2023-10-03 DIAGNOSIS — S82402A Unspecified fracture of shaft of left fibula, initial encounter for closed fracture: Secondary | ICD-10-CM | POA: Diagnosis not present

## 2023-10-03 DIAGNOSIS — S99912A Unspecified injury of left ankle, initial encounter: Secondary | ICD-10-CM | POA: Diagnosis not present

## 2023-10-03 DIAGNOSIS — W1839XA Other fall on same level, initial encounter: Secondary | ICD-10-CM | POA: Diagnosis not present

## 2023-10-03 DIAGNOSIS — Y9319 Activity, other involving water and watercraft: Secondary | ICD-10-CM | POA: Diagnosis not present

## 2023-10-05 ENCOUNTER — Other Ambulatory Visit: Payer: Self-pay | Admitting: Medical

## 2023-10-05 ENCOUNTER — Ambulatory Visit
Admission: RE | Admit: 2023-10-05 | Discharge: 2023-10-05 | Disposition: A | Discharge status: Home / Self Care | Source: Ambulatory Visit | Attending: Medical | Admitting: Medical

## 2023-10-05 DIAGNOSIS — M25572 Pain in left ankle and joints of left foot: Secondary | ICD-10-CM

## 2023-10-05 DIAGNOSIS — S82855A Nondisplaced trimalleolar fracture of left lower leg, initial encounter for closed fracture: Secondary | ICD-10-CM | POA: Diagnosis not present

## 2023-10-07 DIAGNOSIS — S82852A Displaced trimalleolar fracture of left lower leg, initial encounter for closed fracture: Secondary | ICD-10-CM | POA: Diagnosis not present

## 2023-10-08 ENCOUNTER — Other Ambulatory Visit: Payer: Self-pay

## 2023-10-08 ENCOUNTER — Encounter (HOSPITAL_BASED_OUTPATIENT_CLINIC_OR_DEPARTMENT_OTHER): Payer: Self-pay | Admitting: Orthopaedic Surgery

## 2023-10-08 NOTE — Discharge Instructions (Addendum)
 Ali Ink, MD [facility]  Please read the following information regarding your care after surgery.  Medications  You only need a prescription for the narcotic pain medicine (ex. oxycodone, Percocet, Norco).  All of the other medicines listed below are available over the counter. ? Aleve 2 pills twice a day for the first 3 days after surgery. ? acetominophen (Tylenol) 650 mg every 4-6 hours as you need for minor to moderate pain ? oxycodone as prescribed for severe pain  ? To help prevent blood clots, take aspirin (81 mg) twice daily for at least 42 days after surgery.  You should also get up every hour while you are awake to move around.  Weight Bearing ? Do NOT bear any weight on the operated leg or foot. This means do NOT touch your surgical leg to the ground!  Cast / Splint / Dressing ? If you have a splint, do NOT remove this. Keep your splint, cast or dressing clean and dry.  Don't put anything (coat hanger, pencil, etc) down inside of it.  If it gets wet, call the office immediately to schedule an appointment for a cast change.  Swelling IMPORTANT: It is normal for you to have swelling where you had surgery. To reduce swelling and pain, keep at least 3 pillows under your leg so that your toes are above your nose and your heel is above the level of your hip.  It may be necessary to keep your foot or leg elevated for several weeks.  This is critical to helping your incisions heal and your pain to feel better.  Follow Up Call my office at [phone] when you are discharged from the hospital or surgery center to schedule an appointment to be seen 7-10 days after surgery.  Call my office at [phone] if you develop a fever >101.5 F, nausea, vomiting, bleeding from the surgical site or severe pain.     Post Anesthesia Home Care Instructions  Activity: Get plenty of rest for the remainder of the day. A responsible individual must stay with you for 24 hours following the  procedure.  For the next 24 hours, DO NOT: -Drive a car -Advertising copywriter -Drink alcoholic beverages -Take any medication unless instructed by your physician -Make any legal decisions or sign important papers.  Meals: Start with liquid foods such as gelatin or soup. Progress to regular foods as tolerated. Avoid greasy, spicy, heavy foods. If nausea and/or vomiting occur, drink only clear liquids until the nausea and/or vomiting subsides. Call your physician if vomiting continues.  Special Instructions/Symptoms: Your throat may feel dry or sore from the anesthesia or the breathing tube placed in your throat during surgery. If this causes discomfort, gargle with warm salt water. The discomfort should disappear within 24 hours.  Tylenol can be taken at again 7pm if needed.

## 2023-10-08 NOTE — H&P (Signed)
 ORTHOPAEDIC SURGERY H&P  Subjective:  The patient presents with left ankle fx.   No past medical history on file.  Past Surgical History:  Procedure Laterality Date   FINGER SURGERY       (Not in an outpatient encounter)    No Known Allergies  Social History   Socioeconomic History   Marital status: Married    Spouse name: Not on file   Number of children: Not on file   Years of education: Not on file   Highest education level: Not on file  Occupational History   Not on file  Tobacco Use   Smoking status: Never   Smokeless tobacco: Never  Vaping Use   Vaping status: Never Used  Substance and Sexual Activity   Alcohol use: Yes    Comment: occ   Drug use: No   Sexual activity: Not on file  Other Topics Concern   Not on file  Social History Narrative   Not on file   Social Drivers of Health   Financial Resource Strain: Not on file  Food Insecurity: Not on file  Transportation Needs: Not on file  Physical Activity: Not on file  Stress: Not on file  Social Connections: Not on file  Intimate Partner Violence: Not on file     History reviewed. No pertinent family history.   Review of Systems Pertinent items are noted in HPI.  Objective: Vital signs in last 24 hours:    07/17/2018    4:00 AM 07/17/2018    3:21 AM 07/16/2018   11:15 PM  Vitals with BMI  Height   5\' 10"   Weight   175 lbs  BMI   25.11  Systolic 154 131 147  Diastolic 90 81 94  Pulse 69 62 59      EXAM: General: Well nourished, well developed. Awake, alert and oriented to time, place, person. Normal mood and affect. No apparent distress. Breathing room air.  Operative Lower Extremity: Alignment - Neutral Deformity - None Skin intact Tenderness to palpation - left ankle 5/5 TA, PT, GS, Per, EHL, FHL Sensation intact to light touch throughout Palpable DP and PT pulses Special testing: None  The contralateral foot/ankle was examined for comparison and noted to be neurovascularly  intact with no localized deformity, swelling, or tenderness.  Imaging Review All images taken were independently reviewed by me.  Assessment/Plan: The clinical and radiographic findings were reviewed and discussed at length with the patient.  The patient has left ankle fx.  We spoke at length about the natural course of these findings. We discussed nonoperative and operative treatment options in detail.  The risks and benefits were presented and reviewed. The risks due to hardware failure/irritation, new/persistent/recurrent infection, stiffness, nerve/vessel/tendon injury, nonunion/malunion of any fracture, wound healing issues, allograft usage, development of arthritis, failure of this surgery, possibility of external fixation in certain situations, possibility of delayed definitive surgery, need for further surgery, prolonged wound care including further soft tissue coverage procedures, thromboembolic events, anesthesia/medical complications/events perioperatively and beyond, amputation, death among others were discussed. The patient acknowledged the explanation and agreed to proceed with the plan.  Ali Ink  Orthopaedic Surgery [facility]

## 2023-10-13 ENCOUNTER — Ambulatory Visit (HOSPITAL_BASED_OUTPATIENT_CLINIC_OR_DEPARTMENT_OTHER)
Admission: RE | Admit: 2023-10-13 | Discharge: 2023-10-13 | Disposition: A | Discharge status: Home / Self Care | Attending: Orthopaedic Surgery | Admitting: Orthopaedic Surgery

## 2023-10-13 ENCOUNTER — Encounter (HOSPITAL_BASED_OUTPATIENT_CLINIC_OR_DEPARTMENT_OTHER): Payer: Self-pay | Admitting: Orthopaedic Surgery

## 2023-10-13 ENCOUNTER — Ambulatory Visit (HOSPITAL_BASED_OUTPATIENT_CLINIC_OR_DEPARTMENT_OTHER): Payer: Self-pay | Admitting: Anesthesiology

## 2023-10-13 ENCOUNTER — Ambulatory Visit (HOSPITAL_COMMUNITY)

## 2023-10-13 ENCOUNTER — Other Ambulatory Visit: Payer: Self-pay

## 2023-10-13 ENCOUNTER — Encounter (HOSPITAL_BASED_OUTPATIENT_CLINIC_OR_DEPARTMENT_OTHER)
Admission: RE | Disposition: A | Discharge status: Home / Self Care | Payer: Self-pay | Source: Home / Self Care | Attending: Orthopaedic Surgery

## 2023-10-13 DIAGNOSIS — X58XXXA Exposure to other specified factors, initial encounter: Secondary | ICD-10-CM | POA: Insufficient documentation

## 2023-10-13 DIAGNOSIS — S93432A Sprain of tibiofibular ligament of left ankle, initial encounter: Secondary | ICD-10-CM | POA: Diagnosis not present

## 2023-10-13 DIAGNOSIS — S82852A Displaced trimalleolar fracture of left lower leg, initial encounter for closed fracture: Secondary | ICD-10-CM | POA: Insufficient documentation

## 2023-10-13 DIAGNOSIS — S8262XA Displaced fracture of lateral malleolus of left fibula, initial encounter for closed fracture: Secondary | ICD-10-CM | POA: Diagnosis not present

## 2023-10-13 DIAGNOSIS — S82402A Unspecified fracture of shaft of left fibula, initial encounter for closed fracture: Secondary | ICD-10-CM | POA: Diagnosis not present

## 2023-10-13 DIAGNOSIS — S82842A Displaced bimalleolar fracture of left lower leg, initial encounter for closed fracture: Secondary | ICD-10-CM | POA: Diagnosis not present

## 2023-10-13 DIAGNOSIS — S8252XA Displaced fracture of medial malleolus of left tibia, initial encounter for closed fracture: Secondary | ICD-10-CM | POA: Diagnosis not present

## 2023-10-13 DIAGNOSIS — G8918 Other acute postprocedural pain: Secondary | ICD-10-CM | POA: Diagnosis not present

## 2023-10-13 HISTORY — PX: LIGAMENT REPAIR: SHX5444

## 2023-10-13 HISTORY — PX: SYNDESMOSIS REPAIR: SHX5182

## 2023-10-13 HISTORY — PX: ORIF ANKLE FRACTURE: SHX5408

## 2023-10-13 HISTORY — DX: Other specified health status: Z78.9

## 2023-10-13 SURGERY — OPEN REDUCTION INTERNAL FIXATION (ORIF) ANKLE FRACTURE
Anesthesia: General | Site: Ankle | Laterality: Left

## 2023-10-13 MED ORDER — AMISULPRIDE (ANTIEMETIC) 5 MG/2ML IV SOLN: 10.0000 mg | Freq: Once | INTRAVENOUS | Status: DC | PRN

## 2023-10-13 MED ORDER — OXYCODONE HCL 5 MG PO TABS
ORAL_TABLET | ORAL | Status: AC
Start: 2023-10-13 — End: ?
  Filled 2023-10-13: qty 1

## 2023-10-13 MED ORDER — ACETAMINOPHEN 500 MG PO TABS
ORAL_TABLET | ORAL | Status: AC
  Filled 2023-10-13: qty 2

## 2023-10-13 MED ORDER — FENTANYL CITRATE (PF) 100 MCG/2ML IJ SOLN: 25.0000 ug | INTRAMUSCULAR | Status: DC | PRN

## 2023-10-13 MED ORDER — 0.9 % SODIUM CHLORIDE (POUR BTL) OPTIME
TOPICAL | Status: DC | PRN
  Administered 2023-10-13: 200 mL

## 2023-10-13 MED ORDER — FENTANYL CITRATE (PF) 100 MCG/2ML IJ SOLN
INTRAMUSCULAR | Status: AC
  Filled 2023-10-13: qty 2

## 2023-10-13 MED ORDER — CHLORHEXIDINE GLUCONATE 4 % EX SOLN
60.0000 mL | Freq: Once | CUTANEOUS | Status: AC
  Administered 2023-10-13: 4 via TOPICAL

## 2023-10-13 MED ORDER — FENTANYL CITRATE (PF) 100 MCG/2ML IJ SOLN
100.0000 ug | Freq: Once | INTRAMUSCULAR | Status: AC
  Administered 2023-10-13: 100 ug via INTRAVENOUS

## 2023-10-13 MED ORDER — PROPOFOL 10 MG/ML IV BOLUS
INTRAVENOUS | Status: DC | PRN
  Administered 2023-10-13: 200 mg via INTRAVENOUS

## 2023-10-13 MED ORDER — VANCOMYCIN HCL 500 MG IV SOLR
INTRAVENOUS | Status: DC | PRN
  Administered 2023-10-13: 500 mg via TOPICAL

## 2023-10-13 MED ORDER — LACTATED RINGERS IV SOLN: INTRAVENOUS | Status: DC

## 2023-10-13 MED ORDER — GLYCOPYRROLATE 0.2 MG/ML IJ SOLN
INTRAMUSCULAR | Status: DC | PRN
Start: 2023-10-13 — End: 2023-10-13
  Administered 2023-10-13 (×2): .1 mg via INTRAVENOUS

## 2023-10-13 MED ORDER — LIDOCAINE HCL (CARDIAC) PF 100 MG/5ML IV SOSY
PREFILLED_SYRINGE | INTRAVENOUS | Status: DC | PRN
  Administered 2023-10-13: 100 mg via INTRAVENOUS

## 2023-10-13 MED ORDER — ROPIVACAINE HCL 5 MG/ML IJ SOLN
INTRAMUSCULAR | Status: DC | PRN
Start: 2023-10-13 — End: 2023-10-13
  Administered 2023-10-13: 30 mL via PERINEURAL
  Administered 2023-10-13: 15 mL via PERINEURAL

## 2023-10-13 MED ORDER — OXYCODONE HCL 5 MG/5ML PO SOLN: 5.0000 mg | Freq: Once | ORAL | Status: AC | PRN

## 2023-10-13 MED ORDER — MIDAZOLAM HCL 2 MG/2ML IJ SOLN
2.0000 mg | Freq: Once | INTRAMUSCULAR | Status: AC
  Administered 2023-10-13: 2 mg via INTRAVENOUS

## 2023-10-13 MED ORDER — CEFAZOLIN SODIUM-DEXTROSE 2-4 GM/100ML-% IV SOLN
INTRAVENOUS | Status: AC
  Filled 2023-10-13: qty 100

## 2023-10-13 MED ORDER — ACETAMINOPHEN 500 MG PO TABS
1000.0000 mg | ORAL_TABLET | Freq: Once | ORAL | Status: AC
  Administered 2023-10-13: 1000 mg via ORAL

## 2023-10-13 MED ORDER — MIDAZOLAM HCL 2 MG/2ML IJ SOLN
INTRAMUSCULAR | Status: AC
  Filled 2023-10-13: qty 2

## 2023-10-13 MED ORDER — ONDANSETRON HCL 4 MG/2ML IJ SOLN
INTRAMUSCULAR | Status: DC | PRN
  Administered 2023-10-13: 4 mg via INTRAVENOUS

## 2023-10-13 MED ORDER — DEXAMETHASONE SODIUM PHOSPHATE 4 MG/ML IJ SOLN
INTRAMUSCULAR | Status: DC | PRN
  Administered 2023-10-13: 8 mg via INTRAVENOUS

## 2023-10-13 MED ORDER — POVIDONE-IODINE 10 % EX SOLN
CUTANEOUS | Status: DC | PRN
Start: 2023-10-13 — End: 2023-10-13
  Administered 2023-10-13: 1 via TOPICAL

## 2023-10-13 MED ORDER — OXYCODONE HCL 5 MG PO TABS
5.0000 mg | ORAL_TABLET | Freq: Once | ORAL | Status: AC | PRN
  Administered 2023-10-13: 5 mg via ORAL

## 2023-10-13 MED ORDER — CEFAZOLIN SODIUM-DEXTROSE 2-4 GM/100ML-% IV SOLN
2.0000 g | INTRAVENOUS | Status: AC
  Administered 2023-10-13: 2 g via INTRAVENOUS

## 2023-10-13 MED ORDER — FENTANYL CITRATE (PF) 100 MCG/2ML IJ SOLN
INTRAMUSCULAR | Status: DC | PRN
  Administered 2023-10-13: 50 ug via INTRAVENOUS

## 2023-10-13 SURGICAL SUPPLY — 61 items
BANDAGE ESMARK 6X9 LF (GAUZE/BANDAGES/DRESSINGS) IMPLANT
BIT DRILL 2.5X2.75 QC CALB (BIT) IMPLANT
BIT DRILL 2.9X70 QC CALB (BIT) IMPLANT
BLADE SURG 15 STRL LF DISP TIS (BLADE) ×8 IMPLANT
BNDG COHESIVE 4X5 TAN STRL LF (GAUZE/BANDAGES/DRESSINGS) IMPLANT
BNDG ELASTIC 6X10 VLCR STRL LF (GAUZE/BANDAGES/DRESSINGS) ×2 IMPLANT
BNDG GAUZE DERMACEA FLUFF 4 (GAUZE/BANDAGES/DRESSINGS) ×2 IMPLANT
CANISTER SUCT 1200ML W/VALVE (MISCELLANEOUS) IMPLANT
CHLORAPREP W/TINT 26 (MISCELLANEOUS) ×2 IMPLANT
COVER BACK TABLE 60X90IN (DRAPES) ×2 IMPLANT
CUFF TRNQT CYL 34X4.125X (TOURNIQUET CUFF) ×2 IMPLANT
DRAPE C-ARM 42X72 X-RAY (DRAPES) ×2 IMPLANT
DRAPE C-ARMOR (DRAPES) ×2 IMPLANT
DRAPE EXTREMITY T 121X128X90 (DISPOSABLE) ×2 IMPLANT
DRAPE IMP U-DRAPE 54X76 (DRAPES) ×2 IMPLANT
DRAPE U-SHAPE 47X51 STRL (DRAPES) ×2 IMPLANT
DRSG MEPITEL 4X7.2 (GAUZE/BANDAGES/DRESSINGS) ×2 IMPLANT
ELECTRODE REM PT RTRN 9FT ADLT (ELECTROSURGICAL) ×2 IMPLANT
FIXATION ZIPTIGHT ANKLE SNDSMS (Ankle) IMPLANT
GAUZE PAD ABD 8X10 STRL (GAUZE/BANDAGES/DRESSINGS) ×2 IMPLANT
GAUZE SPONGE 4X4 12PLY STRL (GAUZE/BANDAGES/DRESSINGS) ×2 IMPLANT
GLOVE BIOGEL PI IND STRL 8 (GLOVE) ×2 IMPLANT
GLOVE SURG SS PI 7.5 STRL IVOR (GLOVE) ×4 IMPLANT
GOWN STRL REUS W/ TWL LRG LVL3 (GOWN DISPOSABLE) ×4 IMPLANT
KWIRE ACE 1.6X6 (WIRE) IMPLANT
MARKER SKIN DUAL TIP RULER LAB (MISCELLANEOUS) IMPLANT
NDL HYPO 25X1 1.5 SAFETY (NEEDLE) IMPLANT
NEEDLE HYPO 25X1 1.5 SAFETY (NEEDLE) IMPLANT
NS IRRIG 1000ML POUR BTL (IV SOLUTION) ×2 IMPLANT
PACK BASIN DAY SURGERY FS (CUSTOM PROCEDURE TRAY) ×2 IMPLANT
PAD CAST 4YDX4 CTTN HI CHSV (CAST SUPPLIES) ×2 IMPLANT
PADDING CAST ABS COTTON 4X4 ST (CAST SUPPLIES) IMPLANT
PADDING CAST SYNTHETIC 4X4 STR (CAST SUPPLIES) ×6 IMPLANT
PADDING CAST SYNTHETIC 6X4 NS (CAST SUPPLIES) ×4 IMPLANT
PENCIL SMOKE EVACUATOR (MISCELLANEOUS) ×2 IMPLANT
PLATE LOCK 7H 92 BILAT FIB (Plate) IMPLANT
PUTTY DBM STAGRAFT 2CC (Putty) IMPLANT
SCREW LOCK CORT STAR 3.5X10 (Screw) IMPLANT
SCREW LOCK CORT STAR 3.5X16 (Screw) IMPLANT
SCREW NLOCK CANC HEX 4X55 (Screw) IMPLANT
SCREW NON LOCKING LP 3.5 14MM (Screw) IMPLANT
SHEET MEDIUM DRAPE 40X70 STRL (DRAPES) ×2 IMPLANT
SLEEVE SCD COMPRESS KNEE MED (STOCKING) ×2 IMPLANT
SPIKE FLUID TRANSFER (MISCELLANEOUS) IMPLANT
SPLINT FIBERGLASS 4X30 (CAST SUPPLIES) ×4 IMPLANT
SPONGE T-LAP 18X18 ~~LOC~~+RFID (SPONGE) ×2 IMPLANT
STAPLER SKIN PROX WIDE 3.9 (STAPLE) ×2 IMPLANT
STOCKINETTE 6 STRL (DRAPES) IMPLANT
STOCKINETTE ORTHO 6X25 (MISCELLANEOUS) ×2 IMPLANT
SUCTION TUBE FRAZIER 10FR DISP (SUCTIONS) IMPLANT
SUT ETHILON 2 0 FS 18 (SUTURE) ×4 IMPLANT
SUT MNCRL AB 3-0 PS2 18 (SUTURE) IMPLANT
SUT PDS II 3-0 CT2 27 ABS (SUTURE) IMPLANT
SUT VIC AB 0 CT1 27XBRD ANBCTR (SUTURE) IMPLANT
SUT VIC AB 2-0 CT1 TAPERPNT 27 (SUTURE) ×2 IMPLANT
SUT VIC AB 3-0 SH 27X BRD (SUTURE) IMPLANT
SYR BULB IRRIG 60ML STRL (SYRINGE) ×2 IMPLANT
SYR CONTROL 10ML LL (SYRINGE) IMPLANT
TOWEL GREEN STERILE FF (TOWEL DISPOSABLE) ×4 IMPLANT
TUBE CONNECTING 20X1/4 (TUBING) IMPLANT
UNDERPAD 30X36 HEAVY ABSORB (UNDERPADS AND DIAPERS) ×2 IMPLANT

## 2023-10-13 NOTE — H&P (Signed)

## 2023-10-13 NOTE — Op Note (Signed)
 10/13/2023  4:20 PM   PATIENT: Dustin Richardson  48 y.o. male  MRN: [id number]   PRE-OPERATIVE DIAGNOSIS:   Closed displaced trimalleolar fracture of left ankle   POST-OPERATIVE DIAGNOSIS:   Same   PROCEDURE: 1] Left bimalleolar ankle ORIF (lateral and posterior malleoli) 2] Left ankle syndesmosis ORIF   SURGEON:  Ali Ink, MD   ASSISTANT: None   ANESTHESIA: General, regional   EBL: Minimal   TOURNIQUET:    Total Tourniquet Time Documented: Thigh (Left) - 45 minutes Total: Thigh (Left) - 45 minutes    COMPLICATIONS: None apparent   DISPOSITION: Extubated, awake and stable to recovery.   INDICATION FOR PROCEDURE: The patient presented with above diagnosis.  We discussed the diagnosis, alternative treatment options, risks and benefits of the above surgical intervention, as well as alternative non-operative treatments. All questions/concerns were addressed and the patient/family demonstrated appropriate understanding of the diagnosis, the procedure, the postoperative course, and overall prognosis. The patient wished to proceed with surgical intervention and signed an informed surgical consent as such, in each others presence prior to surgery.   PROCEDURE IN DETAIL: After preoperative consent was obtained and the correct operative site was identified, the patient was brought to the operating room supine on stretcher and transferred onto operating table. General anesthesia was induced. Preoperative antibiotics were administered. Surgical timeout was taken. The patient was then positioned supine with an ipsilateral hip bump. The operative lower extremity was prepped and draped in standard sterile fashion with a tourniquet around the thigh. The extremity was exsanguinated and the tourniquet was inflated to 275 mmHg.  A standard lateral incision was made over the distal fibula. Dissection was carried down to the level of the fibula and the fracture site  identified. The superficial peroneal nerve was identified and protected throughout the procedure. The fibula was noted to be shortened with interposed periosteum. The fibula was brought out to length. The fibula fracture was debrided and the edges defined to achieve cortical read. Reduction maneuver was performed using pointed reduction forceps and lobster forceps. In this manner, the fibula length was restored and fracture reduced. A lag screw was not placed given the orientation of fracture lines and severe comminution. Due to poor bone quality and extensive comminution at the fracture site, it was decided to use a locking distal fibula plate. We then selected a Zimmer locking plate to match the anatomy of the distal fibula and placed it laterally. This was implanted under intraoperative fluoroscopy with a combination of distal locking screws and proximal cortical & locking screws.  We then proceeded with reduction and fixation of the posterior distal tibia fragment. We confirmed appropriate reduction using intraoperative fluoroscopy. The posterior distal tibia fragment was reduced directly using window thru the fibula fracture. This piece was deemed too small to receive internal fixation.  A manual external rotation stress radiograph was obtained and demonstrated widening of the ankle mortise. Given this intraoperative finding as well as preoperative subluxation, it was decided to reduce and fix the syndesmosis. Therefore a suture fixation system (ZipTight device) was implanted through the fibula plate in cannulated fashion to fix the syndesmosis. Anchor/button position was verified along anteromedial tibial cortex by fluoroscopy. A repeat stress radiograph showed complete stability of the ankle mortise to testing.  Next, a nonlocking quadricortical hex head 4.0 mm screw was implanted through the fibula plate in appropriate fashion to further reinforce fixation of the syndesmosis. Screw position was  verified along anteromedial tibial cortex by fluoroscopy. The dynamic fixation device  was retensioned to ensure no slack. A repeat stress radiograph showed complete stability of the ankle mortise to testing.  Demineralized bone matrix putty was applied to the fracture site due to native bone loss.   The medial malleolus avulsion fracture was noted to be in appropriate position on intraoperative fluoroscopy. This was deemed too small to receive internal fixation.  The surgical sites were thoroughly irrigated. The tourniquet was deflated and hemostasis achieved. Betadine and vancomycin powder were applied. The deep layers were closed using 2-0 vicryl. The skin was closed without tension.    The leg was cleaned with saline and sterile dressings with gauze were applied. A well padded bulky short leg splint was applied. The patient was awakened from anesthesia and transported to the recovery room in stable condition.    FOLLOW UP PLAN: -transfer to PACU, then home -strict NWB operative extremity, maximum elevation -maintain short leg splint until follow up -DVT ppx: Aspirin 81 mg twice daily while NWB -follow up as outpatient within 7-10 days for wound check with exchange of short leg splint to short leg cast -sutures out in 2-3 weeks in outpatient office   RADIOGRAPHS: AP, lateral, oblique and stress radiographs of the left ankle were obtained intraoperatively. These showed interval reduction and fixation of the fractures. Manual stress radiographs were taken and the joints were noted to be stable following fixation. All hardware is appropriately positioned and of the appropriate lengths. No other acute injuries are noted.   Ali Ink Orthopaedic Surgery [facility]

## 2023-10-13 NOTE — Progress Notes (Signed)
 Assisted Dr. Ardeen Jourdain with left, adductor canal, popliteal, ultrasound guided block. Side rails up, monitors on throughout procedure. See vital signs in flow sheet. Tolerated Procedure well.

## 2023-10-13 NOTE — Anesthesia Procedure Notes (Signed)
 Anesthesia Regional Block: Adductor canal block   Pre-Anesthetic Checklist: , timeout performed,  Correct Patient, Correct Site, Correct Laterality,  Correct Procedure, Correct Position, site marked,  Risks and benefits discussed,  Surgical consent,  Pre-op evaluation,  At surgeon's request and post-op pain management  Laterality: Left  Prep: chloraprep       Needles:  Injection technique: Single-shot  Needle Type: Echogenic Stimulator Needle     Needle Length: 9cm  Needle Gauge: 21     Additional Needles:   Procedures:,,,, ultrasound used (permanent image in chart),,    Narrative:  Start time: 10/13/2023 1:28 PM End time: 10/13/2023 1:30 PM Injection made incrementally with aspirations every 5 mL.  Performed by: Personally  Anesthesiologist: Conard Decent, MD  Additional Notes: Discussed risks and benefits of nerve block including, but not limited to, prolonged and/or permanent nerve injury involving sensory and/or motor function. Monitors were applied and a time-out was performed. The nerve and associated structures were visualized under ultrasound guidance. After negative aspiration, local anesthetic was slowly injected around the nerve. There was no evidence of high pressure during the procedure. There were no paresthesias. VSS remained stable and the patient tolerated the procedure well.

## 2023-10-13 NOTE — Anesthesia Postprocedure Evaluation (Signed)
 Anesthesia Post Note  Patient: Dustin Richardson  Procedure(s) Performed: OPEN REDUCTION INTERNAL FIXATION (ORIF) ANKLE FRACTURE (Left: Ankle) REPAIR, SYNDESMOSIS, ANKLE (Left) REPAIR, LIGAMENT (Left)     Patient location during evaluation: PACU Anesthesia Type: General Level of consciousness: awake Pain management: pain level controlled Vital Signs Assessment: post-procedure vital signs reviewed and stable Respiratory status: spontaneous breathing, nonlabored ventilation and respiratory function stable Cardiovascular status: blood pressure returned to baseline and stable Postop Assessment: no apparent nausea or vomiting Anesthetic complications: no   No notable events documented.  Last Vitals:  Vitals:   10/13/23 1600 10/13/23 1630  BP: (!) 130/90 (!) 139/91  Pulse: 65 (!) 57  Resp: 12 13  Temp:    SpO2: 99% 97%    Last Pain:  Vitals:   10/13/23 1638  TempSrc:   PainSc: 5     LLE Motor Response: No movement due to regional block (10/13/23 1638)            Conard Decent

## 2023-10-13 NOTE — Anesthesia Procedure Notes (Signed)
 Procedure Name: LMA Insertion Date/Time: 10/13/2023 2:48 PM  Performed by: Lonia Ro, CRNAPre-anesthesia Checklist: Patient identified, Emergency Drugs available, Suction available, Patient being monitored and Timeout performed Patient Re-evaluated:Patient Re-evaluated prior to induction Oxygen Delivery Method: Circle system utilized Preoxygenation: Pre-oxygenation with 100% oxygen Induction Type: IV induction Ventilation: Mask ventilation without difficulty LMA: LMA inserted LMA Size: 5.0 Number of attempts: 1 Placement Confirmation: positive ETCO2 and breath sounds checked- equal and bilateral Tube secured with: Tape Dental Injury: Teeth and Oropharynx as per pre-operative assessment

## 2023-10-13 NOTE — Transfer of Care (Signed)
 Immediate Anesthesia Transfer of Care Note  Patient: Dustin Richardson  Procedure(s) Performed: OPEN REDUCTION INTERNAL FIXATION (ORIF) ANKLE FRACTURE (Left: Ankle) REPAIR, SYNDESMOSIS, ANKLE (Left) REPAIR, LIGAMENT (Left)  Patient Location: PACU  Anesthesia Type:General  Level of Consciousness: awake, alert , oriented, and patient cooperative  Airway & Oxygen Therapy: Patient Spontanous Breathing and Patient connected to nasal cannula oxygen  Post-op Assessment: Report given to RN and Post -op Vital signs reviewed and stable  Post vital signs: Reviewed and stable  Last Vitals:  Vitals Value Taken Time  BP 136/88 10/13/23 1555  Temp    Pulse 71 10/13/23 1557  Resp 10 10/13/23 1557  SpO2 99 % 10/13/23 1557  Vitals shown include unfiled device data.  Last Pain:  Vitals:   10/13/23 1250  TempSrc: Temporal  PainSc: 0-No pain      Patients Stated Pain Goal: 4 (10/13/23 1250)  Complications: No notable events documented.

## 2023-10-13 NOTE — Anesthesia Preprocedure Evaluation (Addendum)
 Anesthesia Evaluation  Patient identified by MRN, date of birth, ID band Patient awake    Reviewed: Allergy & Precautions, NPO status , Patient's Chart, lab work & pertinent test results  History of Anesthesia Complications Negative for: history of anesthetic complications  Airway Mallampati: II  TM Distance: >3 FB Neck ROM: Full    Dental  (+) Dental Advisory Given   Pulmonary neg pulmonary ROS   Pulmonary exam normal breath sounds clear to auscultation       Cardiovascular negative cardio ROS  Rhythm:Regular Rate:Normal     Neuro/Psych negative neurological ROS     GI/Hepatic negative GI ROS, Neg liver ROS,,,  Endo/Other  negative endocrine ROS    Renal/GU negative Renal ROS     Musculoskeletal   Abdominal   Peds  Hematology negative hematology ROS (+)   Anesthesia Other Findings   Reproductive/Obstetrics                             Anesthesia Physical Anesthesia Plan  ASA: 1  Anesthesia Plan: General   Post-op Pain Management: Regional block* and Tylenol PO (pre-op)*   Induction: Intravenous  PONV Risk Score and Plan: 2 and Ondansetron, Dexamethasone, Treatment may vary due to age or medical condition and Midazolam  Airway Management Planned: LMA  Additional Equipment:   Intra-op Plan:   Post-operative Plan: Extubation in OR  Informed Consent: I have reviewed the patients History and Physical, chart, labs and discussed the procedure including the risks, benefits and alternatives for the proposed anesthesia with the patient or authorized representative who has indicated his/her understanding and acceptance.     Dental advisory given  Plan Discussed with: CRNA and Anesthesiologist  Anesthesia Plan Comments: (Discussed potential risks of nerve blocks including, but not limited to, infection, bleeding, nerve damage, seizures, pneumothorax, respiratory depression, and  potential failure of the block. Alternatives to nerve blocks discussed. All questions answered.  Risks of general anesthesia discussed including, but not limited to, sore throat, hoarse voice, chipped/damaged teeth, injury to vocal cords, nausea and vomiting, allergic reactions, lung infection, heart attack, stroke, and death. All questions answered. )        Anesthesia Quick Evaluation

## 2023-10-13 NOTE — Anesthesia Procedure Notes (Signed)
 Anesthesia Regional Block: Popliteal block   Pre-Anesthetic Checklist: , timeout performed,  Correct Patient, Correct Site, Correct Laterality,  Correct Procedure, Correct Position, site marked,  Risks and benefits discussed,  Surgical consent,  Pre-op evaluation,  At surgeon's request and post-op pain management  Laterality: Left  Prep: chloraprep       Needles:  Injection technique: Single-shot  Needle Type: Echogenic Stimulator Needle     Needle Length: 9cm  Needle Gauge: 21     Additional Needles:   Procedures:,,,, ultrasound used (permanent image in chart),,    Narrative:  Start time: 10/13/2023 1:22 PM End time: 10/13/2023 1:27 PM Injection made incrementally with aspirations every 5 mL.  Performed by: Personally  Anesthesiologist: Conard Decent, MD  Additional Notes: Discussed risks and benefits of nerve block including, but not limited to, prolonged and/or permanent nerve injury involving sensory and/or motor function. Monitors were applied and a time-out was performed. The nerve and associated structures were visualized under ultrasound guidance. After negative aspiration, local anesthetic was slowly injected around the nerve. There was no evidence of high pressure during the procedure. There were no paresthesias. VSS remained stable and the patient tolerated the procedure well.

## 2023-10-14 ENCOUNTER — Encounter (HOSPITAL_BASED_OUTPATIENT_CLINIC_OR_DEPARTMENT_OTHER): Payer: Self-pay | Admitting: Orthopaedic Surgery

## 2023-10-22 DIAGNOSIS — S82852D Displaced trimalleolar fracture of left lower leg, subsequent encounter for closed fracture with routine healing: Secondary | ICD-10-CM | POA: Diagnosis not present

## 2023-11-05 DIAGNOSIS — S82852D Displaced trimalleolar fracture of left lower leg, subsequent encounter for closed fracture with routine healing: Secondary | ICD-10-CM | POA: Diagnosis not present

## 2023-11-26 DIAGNOSIS — S82852A Displaced trimalleolar fracture of left lower leg, initial encounter for closed fracture: Secondary | ICD-10-CM | POA: Diagnosis not present

## 2023-12-07 DIAGNOSIS — S82852D Displaced trimalleolar fracture of left lower leg, subsequent encounter for closed fracture with routine healing: Secondary | ICD-10-CM | POA: Diagnosis not present

## 2024-01-07 DIAGNOSIS — M25572 Pain in left ankle and joints of left foot: Secondary | ICD-10-CM | POA: Diagnosis not present

## 2024-01-11 DIAGNOSIS — S82852D Displaced trimalleolar fracture of left lower leg, subsequent encounter for closed fracture with routine healing: Secondary | ICD-10-CM | POA: Diagnosis not present

## 2024-01-14 DIAGNOSIS — M25572 Pain in left ankle and joints of left foot: Secondary | ICD-10-CM | POA: Diagnosis not present

## 2024-01-26 DIAGNOSIS — S82852D Displaced trimalleolar fracture of left lower leg, subsequent encounter for closed fracture with routine healing: Secondary | ICD-10-CM | POA: Diagnosis not present

## 2024-02-21 DIAGNOSIS — T8489XA Other specified complication of internal orthopedic prosthetic devices, implants and grafts, initial encounter: Secondary | ICD-10-CM | POA: Diagnosis not present

## 2024-02-21 DIAGNOSIS — X58XXXA Exposure to other specified factors, initial encounter: Secondary | ICD-10-CM | POA: Diagnosis not present

## 2024-02-21 DIAGNOSIS — Y999 Unspecified external cause status: Secondary | ICD-10-CM | POA: Diagnosis not present

## 2024-02-21 DIAGNOSIS — S93432A Sprain of tibiofibular ligament of left ankle, initial encounter: Secondary | ICD-10-CM | POA: Diagnosis not present

## 2024-02-21 DIAGNOSIS — Z472 Encounter for removal of internal fixation device: Secondary | ICD-10-CM | POA: Diagnosis not present

## 2024-03-10 DIAGNOSIS — S82852D Displaced trimalleolar fracture of left lower leg, subsequent encounter for closed fracture with routine healing: Secondary | ICD-10-CM | POA: Diagnosis not present
